# Patient Record
Sex: Male | Born: 1985 | Race: Black or African American | Hispanic: No | Marital: Single | Smoking: Never smoker
Health system: Southern US, Community
[De-identification: ages and names within clinical notes are randomized; demographics above are authoritative.]

## PROBLEM LIST (undated history)

## (undated) DIAGNOSIS — I10 Essential (primary) hypertension: Secondary | ICD-10-CM

---

## 2007-08-23 ENCOUNTER — Emergency Department (HOSPITAL_COMMUNITY): Admission: EM | Admit: 2007-08-23 | Discharge: 2007-08-23 | Payer: Self-pay | Admitting: Emergency Medicine

## 2007-09-06 ENCOUNTER — Emergency Department (HOSPITAL_COMMUNITY): Admission: EM | Admit: 2007-09-06 | Discharge: 2007-09-06 | Payer: Self-pay | Admitting: Emergency Medicine

## 2007-09-12 ENCOUNTER — Encounter (INDEPENDENT_AMBULATORY_CARE_PROVIDER_SITE_OTHER): Payer: Self-pay | Admitting: *Deleted

## 2008-04-11 ENCOUNTER — Emergency Department (HOSPITAL_COMMUNITY): Admission: EM | Admit: 2008-04-11 | Discharge: 2008-04-11 | Payer: Self-pay | Admitting: Emergency Medicine

## 2009-09-22 ENCOUNTER — Emergency Department (HOSPITAL_COMMUNITY): Admission: EM | Admit: 2009-09-22 | Discharge: 2009-09-22 | Payer: Self-pay | Admitting: Emergency Medicine

## 2012-04-03 ENCOUNTER — Encounter (HOSPITAL_COMMUNITY): Payer: Self-pay | Admitting: *Deleted

## 2012-04-03 ENCOUNTER — Emergency Department (HOSPITAL_COMMUNITY)
Admission: EM | Admit: 2012-04-03 | Discharge: 2012-04-04 | Disposition: A | Discharge status: Home / Self Care | Payer: Self-pay | Attending: Emergency Medicine | Admitting: Emergency Medicine

## 2012-04-03 DIAGNOSIS — S61209A Unspecified open wound of unspecified finger without damage to nail, initial encounter: Secondary | ICD-10-CM | POA: Insufficient documentation

## 2012-04-03 DIAGNOSIS — W268XXA Contact with other sharp object(s), not elsewhere classified, initial encounter: Secondary | ICD-10-CM | POA: Insufficient documentation

## 2012-04-03 DIAGNOSIS — IMO0002 Reserved for concepts with insufficient information to code with codable children: Secondary | ICD-10-CM

## 2012-04-03 DIAGNOSIS — Z23 Encounter for immunization: Secondary | ICD-10-CM | POA: Insufficient documentation

## 2012-04-03 DIAGNOSIS — S61409A Unspecified open wound of unspecified hand, initial encounter: Secondary | ICD-10-CM | POA: Insufficient documentation

## 2012-04-03 MED ORDER — TETANUS-DIPHTH-ACELL PERTUSSIS 5-2.5-18.5 LF-MCG/0.5 IM SUSP
0.5000 mL | Freq: Once | INTRAMUSCULAR | Status: AC
  Administered 2012-04-04: 0.5 mL via INTRAMUSCULAR
  Filled 2012-04-03: qty 0.5

## 2012-04-03 MED ORDER — LIDOCAINE HCL 2 % IJ SOLN
20.0000 mL | Freq: Once | INTRAMUSCULAR | Status: AC
  Administered 2012-04-04: 20 mg via INTRADERMAL

## 2012-04-03 NOTE — ED Notes (Signed)
Pt reports cut hand on the sharp end of a broom.  Pt reports cut to palm of hand and thumb. Bleeding currently controlled. Pt is unsure of last tetanus shot.  Pt reports pain is 3/10

## 2012-04-03 NOTE — ED Provider Notes (Signed)
History     CSN: [id number]  Arrival date & time 04/03/12  2153   First MD Initiated Contact with Patient 04/03/12 2315      Chief Complaint  Patient presents with  . Laceration  . Hand Pain    (Consider location/radiation/quality/duration/timing/severity/associated sxs/prior treatment) HPI Comments: Patient presents after cutting his right palm and base of thumb on metal handle of an aluminum broom. Patient rinsed the wounds with water prior to arrival and applied a bandage. Denies numbness, tingling, or weakness in hand. Denies other injuries. Last tetanus uncertain. Onset acute. Course constant. Nothing makes symptoms better or worse.   Patient is a 26 y.o. male presenting with skin laceration. The history is provided by the patient.  Laceration  The incident occurred 1 to 2 hours ago. The laceration is located on the right hand. The laceration is 6 cm in size. The laceration mechanism was a a metal edge. The pain is mild. The pain has been constant since onset. He reports no foreign bodies present. His tetanus status is out of date.    History reviewed. No pertinent past medical history.  History reviewed. No pertinent past surgical history.  No family history on file.  History  Substance Use Topics  . Smoking status: Never Smoker   . Smokeless tobacco: Not on file  . Alcohol Use: Yes      Review of Systems  Constitutional: Negative for activity change.  HENT: Negative for neck pain.   Musculoskeletal: Negative for back pain, joint swelling, arthralgias and gait problem.  Skin: Positive for wound.  Neurological: Negative for weakness and numbness.    Allergies  Review of patient's allergies indicates no known allergies.  Home Medications  No current outpatient prescriptions on file.  BP 159/111  Pulse 61  Temp 98.7 F (37.1 C) (Oral)  Resp 18  Ht 6' (1.829 m)  Wt 175 lb (79.379 kg)  BMI 23.73 kg/m2  SpO2 99%  Physical Exam  Nursing note and vitals  reviewed. Constitutional: He appears well-developed and well-nourished.  HENT:  Head: Normocephalic and atraumatic.  Eyes: Conjunctivae are normal.  Neck: Normal range of motion. Neck supple.  Cardiovascular: Normal pulses.   Pulses:      Radial pulses are 2+ on the right side, and 2+ on the left side.  Musculoskeletal: Normal range of motion. He exhibits tenderness. He exhibits no edema.       Tenderness in area of wound. Cap refill in all digits < 2 seconds. 5/5 strength of all digits and full ROM including flexion/extension of IP joint R thumb, MCP joint R thumb in all directions.   Neurological: He is alert. No sensory deficit.       Motor, sensation, and vascular distal to the injury is fully intact.   Skin: Skin is warm and dry.       Patient with 2 lacerations:  R palm just proximal to 3rd and 4th digits, linear, full skin thickness, hemostatic, clean. Base of wound visualized entirely. No FB seen or palpated. No tendon injury identified.   Base of right thumb, c-shaped, flap, full thickness, hemostatic, clean. Base of wound visualized entirely. No FB seen or palpated. No tendon injury identified.   Psychiatric: He has a normal mood and affect.    ED Course  Procedures (including critical care time)  Labs Reviewed - No data to display No results found.   No diagnosis found.  11:55 PM Patient seen and examined. Tetanus ordered. Will need to suture.  Vital signs reviewed and are as follows: Filed Vitals:   04/03/12 2248  BP: 159/111  Pulse: 61  Temp: 98.7 F (37.1 C)  Resp: 18   LACERATION REPAIR Performed by: Carolee Rota Authorized by: Carolee Rota Consent: Verbal consent obtained. Risks and benefits: risks, benefits and alternatives were discussed Consent given by: patient Patient identity confirmed: provided demographic data Prepped and Draped in normal sterile fashion Wound explored  Laceration Location: R thumb base  Laceration Length: 3cm  No  Foreign Bodies seen or palpated  Anesthesia: local infiltration  Local anesthetic: lidocaine 2% without epinephrine  Anesthetic total: 3 ml  Irrigation method: syringe with splash guard Amount of cleaning: standard  Skin closure: 5-0 Ethilon  Number of sutures: 3  Technique: simple interrupted  Patient tolerance: Patient tolerated the procedure well with no immediate complications.    LACERATION REPAIR Performed by: Carolee Rota Authorized by: Carolee Rota Consent: Verbal consent obtained. Risks and benefits: risks, benefits and alternatives were discussed Consent given by: patient Patient identity confirmed: provided demographic data Prepped and Draped in normal sterile fashion Wound explored  Laceration Location: right palm  Laceration Length: 3cm  No Foreign Bodies seen or palpated  Anesthesia: local infiltration  Local anesthetic: lidocaine 2% without epinephrine  Anesthetic total: 3 ml  Irrigation method: syringe with splash guard Amount of cleaning: standard  Skin closure: 5-0 Ethilon  Number of sutures: 5  Technique: locked running  Patient tolerance: Patient tolerated the procedure well with no immediate complications.   Patient counseled on wound care. Patient counseled on need to return or see PCP/urgent care for suture removal in 7 days. Patient was urged to return to the Emergency Department urgently with worsening pain, swelling, expanding erythema especially if it streaks away from the affected area, fever, or if they have any other concerns. Patient verbalized understanding.    MDM  Patient with 2 lacerations, repaired. No tendon involvement, FB identified or suspected. Full ROM and strength in R hand and digits. No x-rays indicated.         [place], [place] 04/04/12 [phone]

## 2012-04-05 NOTE — ED Provider Notes (Signed)
Medical screening examination/treatment/procedure(s) were performed by non-physician practitioner and as supervising physician I was immediately available for consultation/collaboration.  Arvle Grabe, MD 04/05/12 0841 

## 2013-02-02 ENCOUNTER — Encounter (HOSPITAL_COMMUNITY): Payer: Self-pay | Admitting: *Deleted

## 2013-02-02 ENCOUNTER — Emergency Department (HOSPITAL_COMMUNITY)
Admission: EM | Admit: 2013-02-02 | Discharge: 2013-02-02 | Discharge status: Against Medical Advice | Payer: Self-pay | Attending: Emergency Medicine | Admitting: Emergency Medicine

## 2013-02-02 DIAGNOSIS — IMO0002 Reserved for concepts with insufficient information to code with codable children: Secondary | ICD-10-CM | POA: Insufficient documentation

## 2013-02-02 DIAGNOSIS — Y9302 Activity, running: Secondary | ICD-10-CM | POA: Insufficient documentation

## 2013-02-02 DIAGNOSIS — R296 Repeated falls: Secondary | ICD-10-CM | POA: Insufficient documentation

## 2013-02-02 DIAGNOSIS — Y929 Unspecified place or not applicable: Secondary | ICD-10-CM | POA: Insufficient documentation

## 2013-02-02 NOTE — ED Notes (Signed)
Pt refusing to be seen.  Band aid applied to L knee.  Denies any complaints at this time.
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
## 2013-03-19 ENCOUNTER — Encounter (HOSPITAL_COMMUNITY): Payer: Self-pay | Admitting: Emergency Medicine

## 2013-03-19 ENCOUNTER — Emergency Department (HOSPITAL_COMMUNITY)
Admission: EM | Admit: 2013-03-19 | Discharge: 2013-03-19 | Disposition: A | Discharge status: Home / Self Care | Payer: Self-pay | Attending: Emergency Medicine | Admitting: Emergency Medicine

## 2013-03-19 ENCOUNTER — Emergency Department (HOSPITAL_COMMUNITY): Payer: Self-pay

## 2013-03-19 DIAGNOSIS — M2392 Unspecified internal derangement of left knee: Secondary | ICD-10-CM

## 2013-03-19 DIAGNOSIS — IMO0002 Reserved for concepts with insufficient information to code with codable children: Secondary | ICD-10-CM | POA: Insufficient documentation

## 2013-03-19 DIAGNOSIS — I10 Essential (primary) hypertension: Secondary | ICD-10-CM | POA: Insufficient documentation

## 2013-03-19 DIAGNOSIS — Y939 Activity, unspecified: Secondary | ICD-10-CM | POA: Insufficient documentation

## 2013-03-19 DIAGNOSIS — S8392XA Sprain of unspecified site of left knee, initial encounter: Secondary | ICD-10-CM

## 2013-03-19 DIAGNOSIS — Y929 Unspecified place or not applicable: Secondary | ICD-10-CM | POA: Insufficient documentation

## 2013-03-19 HISTORY — DX: Essential (primary) hypertension: I10
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
## 2013-03-19 NOTE — ED Notes (Signed)
PT. REPORTS LEFT KNEE PAIN - UNABLE TO GIVE CAUSE OF PAIN OR INJURY , AMBULATORY , PRESENTS WITH [employer] OFFICERS.

## 2013-07-24 DIAGNOSIS — I10 Essential (primary) hypertension: Secondary | ICD-10-CM | POA: Insufficient documentation

## 2013-07-24 DIAGNOSIS — J020 Streptococcal pharyngitis: Secondary | ICD-10-CM | POA: Insufficient documentation

## 2013-07-25 ENCOUNTER — Encounter (HOSPITAL_COMMUNITY): Payer: Self-pay | Admitting: Emergency Medicine

## 2013-07-25 ENCOUNTER — Emergency Department (HOSPITAL_COMMUNITY)
Admission: EM | Admit: 2013-07-25 | Discharge: 2013-07-25 | Disposition: A | Discharge status: Home / Self Care | Payer: Self-pay | Attending: Emergency Medicine | Admitting: Emergency Medicine

## 2013-07-25 ENCOUNTER — Telehealth (HOSPITAL_COMMUNITY): Payer: Self-pay | Admitting: Emergency Medicine

## 2013-07-25 DIAGNOSIS — J020 Streptococcal pharyngitis: Secondary | ICD-10-CM

## 2013-07-25 LAB — RAPID STREP SCREEN (MED CTR MEBANE ONLY): Streptococcus, Group A Screen (Direct): POSITIVE — AB

## 2013-07-25 MED ORDER — IBUPROFEN 400 MG PO TABS
800.0000 mg | ORAL_TABLET | Freq: Once | ORAL | Status: AC
  Administered 2013-07-25: 800 mg via ORAL
  Filled 2013-07-25: qty 4

## 2013-07-25 MED ORDER — AMOXICILLIN 500 MG PO CAPS: 500.0000 mg | ORAL_CAPSULE | Freq: Three times a day (TID) | ORAL | Status: AC

## 2013-07-25 NOTE — ED Notes (Addendum)
Pt here with c/o sore throat onset last morning. Pt states he noticed painful swallowing with drinking something in the morning. Redness noted at epiglottis.

## 2013-07-25 NOTE — ED Provider Notes (Signed)
CSN: [id number]     Arrival date & time 07/24/13  2353 History   First MD Initiated Contact with Patient 07/25/13 0007     Chief Complaint  Patient presents with  . Sore Throat   HPI  History provided by the patient. The patient is a 27 year old male with history of hypertension who presents with complaints of sore throat. Patient reports waking up in the morning with a sore throat. He reports having significant increasing pain to the throat area. Pain is worse with any swallowing. He has not used any medicines or treatment for symptoms. He denies any known sick contacts. No recent travel. Denies any associated fever, chills or sweats. Denies any cough or congestion. No other aggravating or alleviating factors. No other associated symptoms.     Past Medical History  Diagnosis Date  . Hypertension    No past surgical history on file. No family history on file. History  Substance Use Topics  . Smoking status: Never Smoker   . Smokeless tobacco: Not on file  . Alcohol Use: Yes    Review of Systems  Constitutional: Negative for fever, chills and diaphoresis.  HENT: Positive for sore throat. Negative for congestion and rhinorrhea.   Respiratory: Negative for cough.   Gastrointestinal: Negative for nausea and vomiting.  All other systems reviewed and are negative.    Allergies  Review of patient's allergies indicates no known allergies.  Home Medications  No current outpatient prescriptions on file. BP 161/107  Pulse 74  Temp(Src) 97.2 F (36.2 C) (Oral)  Resp 16  Ht 6' (1.829 m)  Wt 185 lb 8 oz (84.142 kg)  BMI 25.15 kg/m2  SpO2 100% Physical Exam  Nursing note and vitals reviewed. Constitutional: He is oriented to person, place, and time. He appears well-developed and well-nourished.  HENT:  Head: Normocephalic.  Right Ear: Tympanic membrane normal.  Left Ear: Tympanic membrane normal.  2 significant erythema of the pharynx. Tonsils normal in size without exudate.  Uvula midline.  Eyes: Conjunctivae are normal.  Neck: Normal range of motion. Neck supple.  Cardiovascular: Normal rate and regular rhythm.   Pulmonary/Chest: Effort normal and breath sounds normal. No respiratory distress. He has no wheezes. He has no rales.  Abdominal: Soft.  Lymphadenopathy:    He has cervical adenopathy.  Neurological: He is alert and oriented to person, place, and time.  Skin: Skin is warm. No rash noted.  Psychiatric: He has a normal mood and affect. His behavior is normal.    ED Course  Procedures   Patient seen and evaluated. Patient well-appearing no acute distress. Does not appear severely ill or toxic. Has significant erythema the pharynx. No exudate. Slight anterior cervical adenopathy. No other symptoms.  Strep test positive. Will plan for treatment. Patient agrees with treatment plan.  Results for orders placed during the hospital encounter of 07/25/13  RAPID STREP SCREEN      Result Value Range   Streptococcus, Group A Screen (Direct) POSITIVE (*) NEGATIVE      MDM   1. Strep throat        Angus Seller, PA-C 07/25/13 [phone]

## 2013-07-25 NOTE — ED Provider Notes (Signed)
Medical screening examination/treatment/procedure(s) were performed by non-physician practitioner and as supervising physician I was immediately available for consultation/collaboration.    Vida Roller, MD 07/25/13 [phone]

## 2013-09-01 IMAGING — CR DG KNEE COMPLETE 4+V*L*
4 series · 4 of 4 positions shown · non-contrast
Comparison: None.

CLINICAL DATA: Left knee injury, pain.

LEFT KNEE - COMPLETE 4+ VIEW

[t knee ap left]
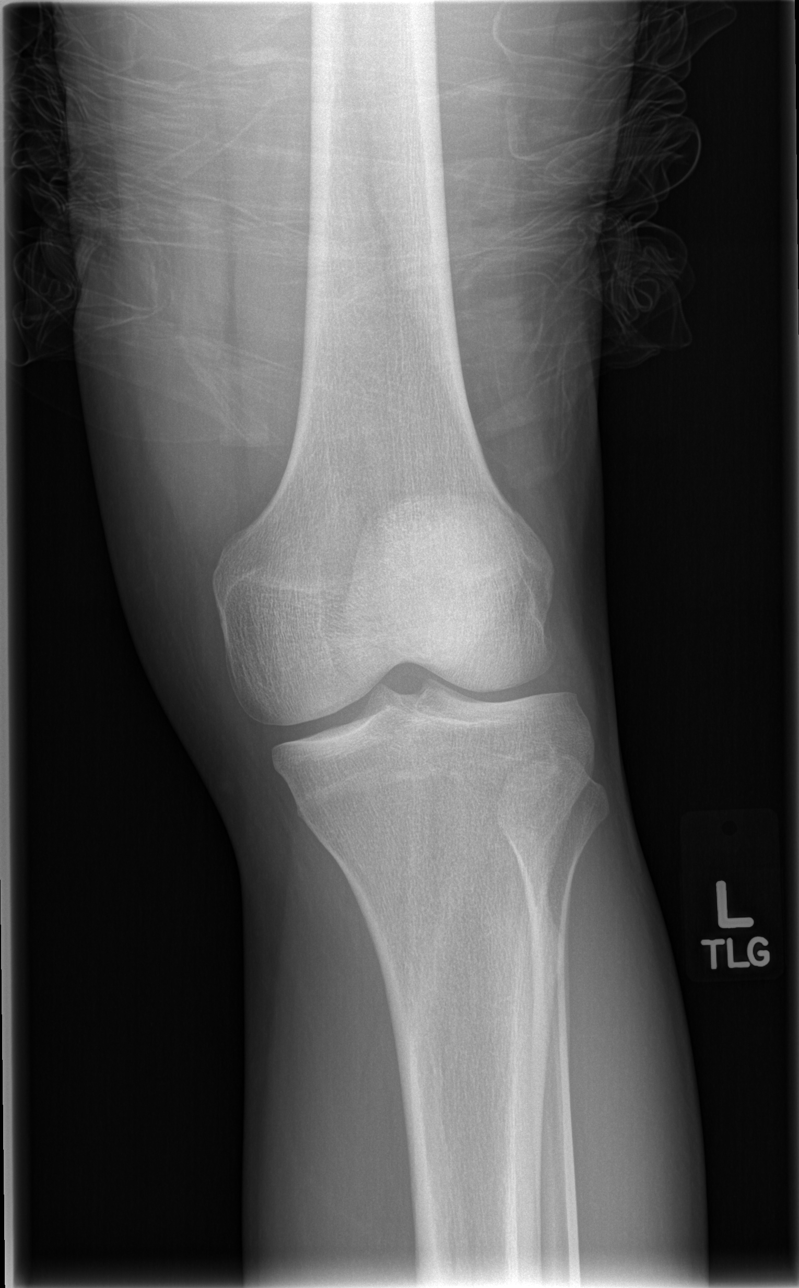

[t knee oblique left (1 of 2)]
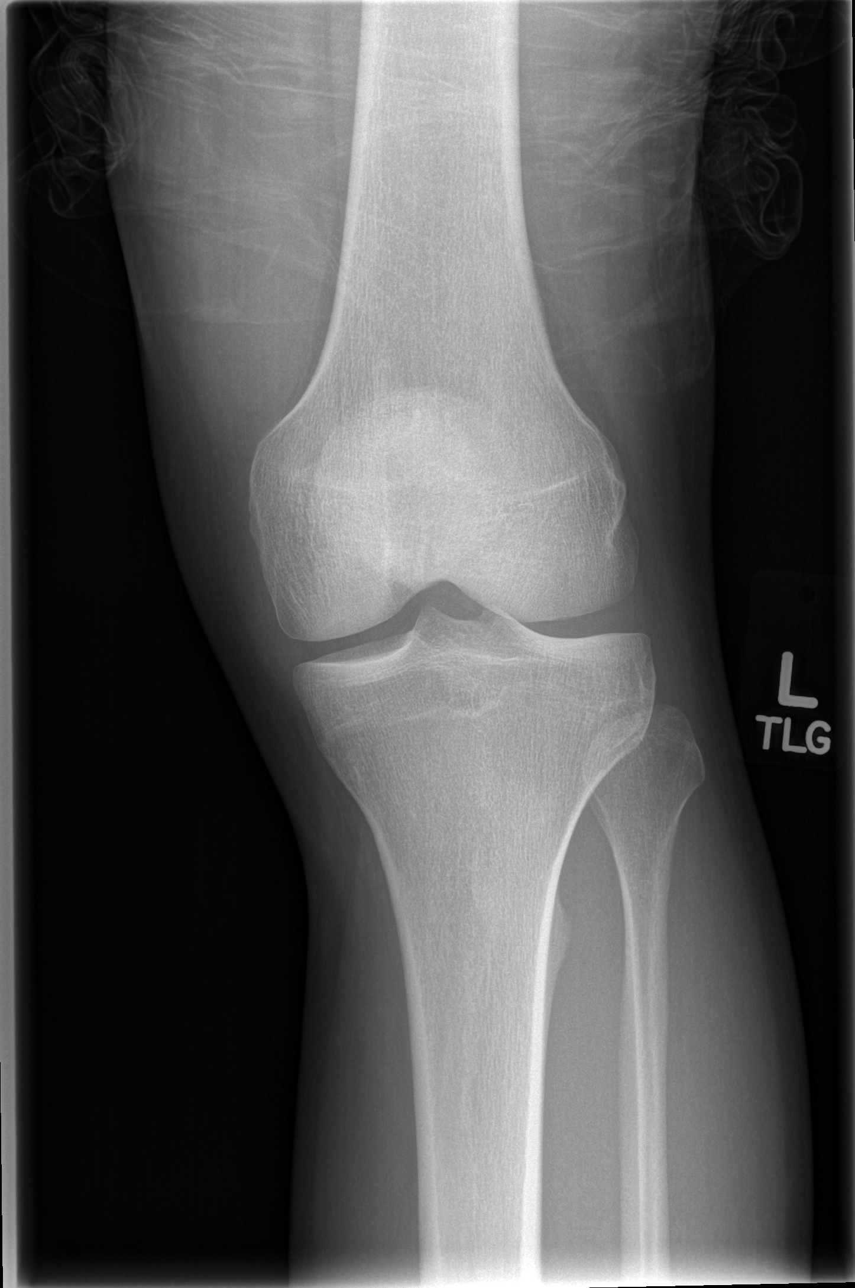

[t knee oblique left (2 of 2)]
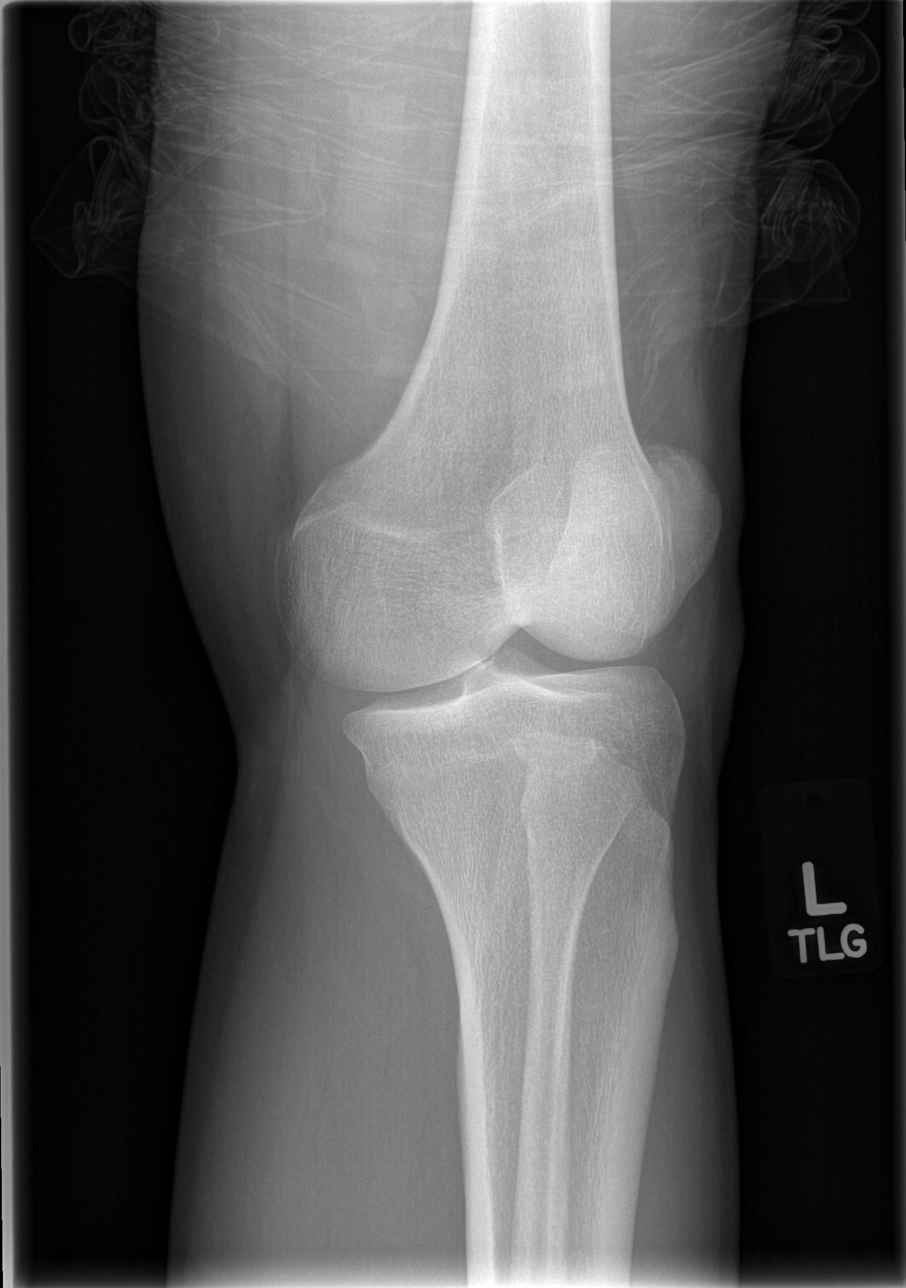

[t knee lat left]
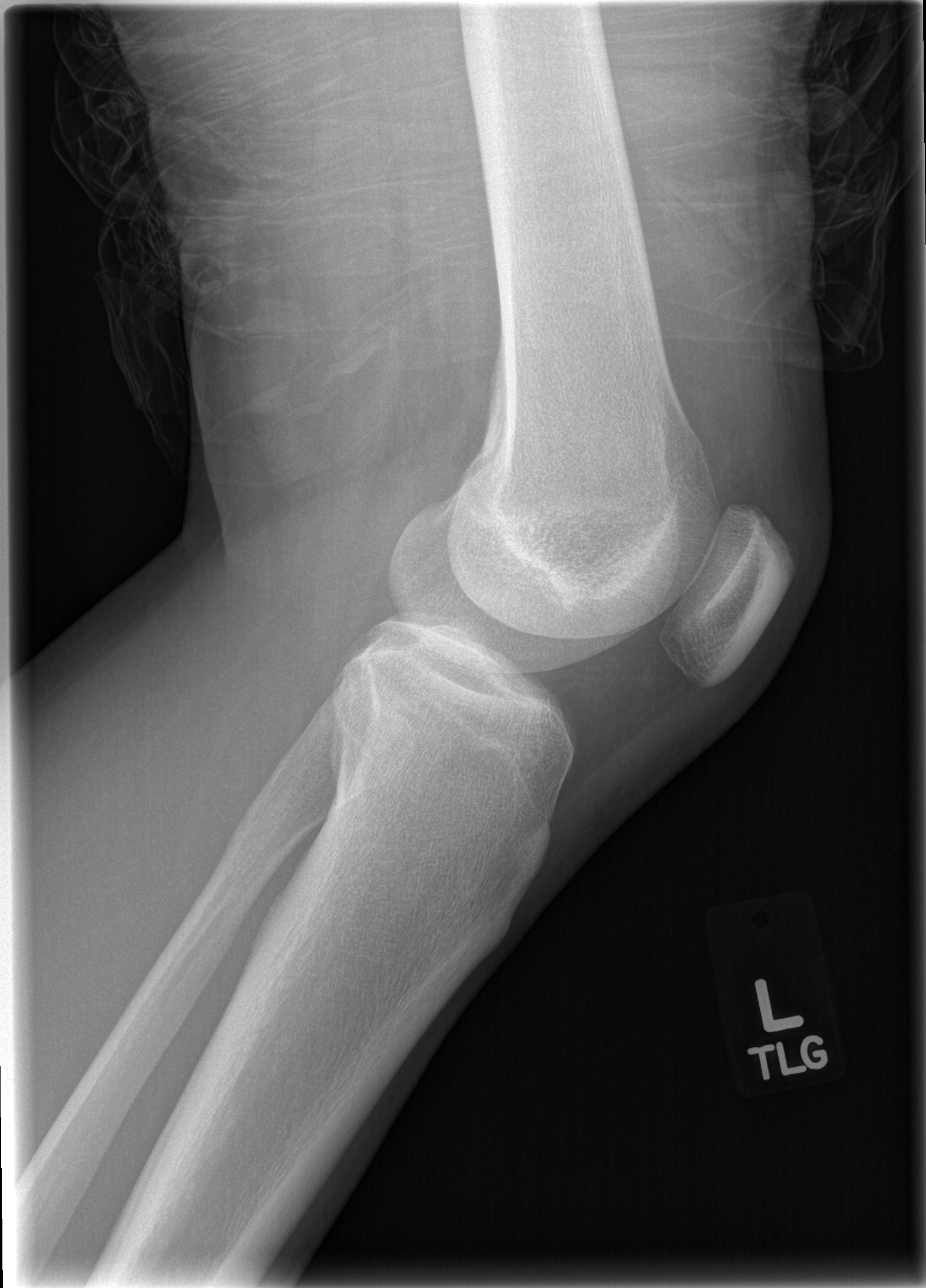

[4 of 4 positions shown; findings below may reference images not displayed]

FINDINGS: Imaged bones, joints and soft tissues appear normal.
IMPRESSION: Negative exam.

## 2019-08-04 ENCOUNTER — Emergency Department (HOSPITAL_COMMUNITY)
Admission: EM | Admit: 2019-08-04 | Discharge: 2019-08-04 | Disposition: A | Discharge status: Home / Self Care | Payer: Self-pay | Attending: Emergency Medicine | Admitting: Emergency Medicine

## 2019-08-04 ENCOUNTER — Emergency Department (HOSPITAL_COMMUNITY): Payer: Self-pay

## 2019-08-04 ENCOUNTER — Encounter (HOSPITAL_COMMUNITY): Payer: Self-pay | Admitting: *Deleted

## 2019-08-04 ENCOUNTER — Other Ambulatory Visit: Payer: Self-pay

## 2019-08-04 DIAGNOSIS — R0781 Pleurodynia: Secondary | ICD-10-CM | POA: Insufficient documentation

## 2019-08-04 DIAGNOSIS — I10 Essential (primary) hypertension: Secondary | ICD-10-CM | POA: Insufficient documentation

## 2019-08-04 LAB — BASIC METABOLIC PANEL
Anion gap: 9 (ref 5–15)
BUN: 10 mg/dL (ref 6–20)
CO2: 26 mmol/L (ref 22–32)
Calcium: 9.2 mg/dL (ref 8.9–10.3)
Chloride: 105 mmol/L (ref 98–111)
Creatinine, Ser: 0.99 mg/dL (ref 0.61–1.24)
GFR calc Af Amer: >60 mL/min (ref >60)
GFR calc non Af Amer: >60 mL/min (ref >60)
Glucose, Bld: 102 mg/dL — ABNORMAL HIGH (ref 70–99)
Potassium: 3.6 mmol/L (ref 3.5–5.1)
Sodium: 140 mmol/L (ref 135–145)

## 2019-08-04 LAB — CBC
HCT: 44.5 % (ref 39.0–52.0)
Hemoglobin: 14.5 g/dL (ref 13.0–17.0)
MCH: 29.3 pg (ref 26.0–34.0)
MCHC: 32.6 g/dL (ref 30.0–36.0)
MCV: 89.9 fL (ref 80.0–100.0)
Platelets: 239 10*3/uL (ref 150–400)
RBC: 4.95 MIL/uL (ref 4.22–5.81)
RDW: 13.7 % (ref 11.5–15.5)
WBC: 10.3 10*3/uL (ref 4.0–10.5)
nRBC: 0 % (ref 0.0–0.2)

## 2019-08-04 LAB — TROPONIN I (HIGH SENSITIVITY)
Troponin I (High Sensitivity): 5 ng/L (ref <18)
Troponin I (High Sensitivity): 6 ng/L (ref <18)

## 2019-08-04 NOTE — Discharge Instructions (Addendum)
Your blood pressure is extremely high. You really should be on medication. Most people do not have symptoms from their high blood pressure. Uncontrolled high blood pressure will give you problems in the future that you will not want to have though. I know you don't like the idea of being on medication but it will lead to problems that would require multiple additional medications. Please follow-up with your family doctor to discuss further.

## 2019-08-04 NOTE — ED Triage Notes (Signed)
Chest pain worse when taking a deep breath, out of BLOOD PRESSURE MEDS

## 2019-08-16 NOTE — ED Provider Notes (Signed)
[facility] EMERGENCY DEPARTMENT Provider Note   CSN: [id number] Arrival date & time: 08/04/19  1507     History Chief Complaint  Patient presents with  . Pleurisy    Steven Morse is a 34 y.o. male.  HPI   34 year old male with chest pain.  Onset 3 to 4 days ago.  He describes a sharp pain in the center of his chest when he takes a deep breath.  Occasional cough.  Pain is sometimes worse with coughing 2.  Cough is nonproductive.  Denies dyspnea.  No unusual leg pain or swelling.  No fevers or chills.  No sick contacts that he is aware of.  Past Medical History:  Diagnosis Date  . Hypertension     There are no problems to display for this patient.   History reviewed. No pertinent surgical history.     No family history on file.  Social History   Tobacco Use  . Smoking status: Never Smoker  Substance Use Topics  . Alcohol use: Yes  . Drug use: Yes    Types: Marijuana    Home Medications Prior to Admission medications   Medication Sig Start Date End Date Taking? Authorizing Provider  amoxicillin (AMOXIL) 500 MG capsule Take 1 capsule (500 mg total) by mouth 3 (three) times daily. 07/25/13   Ivonne Andrew, PA-C    Allergies    Patient has no known allergies.  Review of Systems   Review of Systems All systems reviewed and negative, other than as noted in HPI.  Physical Exam Updated Vital Signs BP (!) 200/116 (BP Location: Right Arm)   Pulse 92   Temp 98.6 F (37 C) (Oral)   Resp 19   Ht 6' (1.829 m)   Wt 81.6 kg   SpO2 100%   BMI 24.41 kg/m   Physical Exam Vitals and nursing note reviewed.  Constitutional:      General: He is not in acute distress.    Appearance: He is well-developed.  HENT:     Head: Normocephalic and atraumatic.  Eyes:     General:        Right eye: No discharge.        Left eye: No discharge.     Conjunctiva/sclera: Conjunctivae normal.  Cardiovascular:     Rate and Rhythm: Normal rate and regular rhythm.   Heart sounds: Normal heart sounds. No murmur. No friction rub. No gallop.   Pulmonary:     Effort: Pulmonary effort is normal. No respiratory distress.     Breath sounds: Normal breath sounds.  Abdominal:     General: There is no distension.     Palpations: Abdomen is soft.     Tenderness: There is no abdominal tenderness.  Musculoskeletal:        General: No tenderness.     Cervical back: Neck supple.     Comments: Lower extremities symmetric as compared to each other. No calf tenderness. Negative Homan's. No palpable cords.   Skin:    General: Skin is warm and dry.  Neurological:     Mental Status: He is alert.  Psychiatric:        Behavior: Behavior normal.        Thought Content: Thought content normal.     ED Results / Procedures / Treatments   Labs (all labs ordered are listed, but only abnormal results are displayed) Labs Reviewed  BASIC METABOLIC PANEL - Abnormal; Notable for the following components:      Result  Value   Glucose, Bld 102 (*)    All other components within normal limits  CBC  TROPONIN I (HIGH SENSITIVITY)  TROPONIN I (HIGH SENSITIVITY)    EKG EKG Interpretation  Date/Time:  Monday August 04 2019 17:24:34 EST Ventricular Rate:  88 PR Interval:  160 QRS Duration: 96 QT Interval:  372 QTC Calculation: 450 R Axis:   128 Text Interpretation: Normal sinus rhythm Right axis deviation Right ventricular hypertrophy Abnormal ECG Confirmed by Veryl Speak [phone]) on 08/05/2019 11:59:11 PM   Radiology No results found.   DG Chest 2 View  Result Date: 08/04/2019 CLINICAL DATA:  Chest pain worsened by taking a deep breath, out of medication for hypertension hypertension EXAM: CHEST - 2 VIEW COMPARISON:  None FINDINGS: The heart size and mediastinal contours are within normal limits. Both lungs are clear. The visualized skeletal structures are unremarkable. IMPRESSION: No active cardiopulmonary disease. Electronically Signed   By: Lavonia Dana M.D.    On: 08/04/2019 17:54    Procedures Procedures (including critical care time)  Medications Ordered in ED Medications - No data to display  ED Course  I have reviewed the triage vital signs and the nursing notes.  Pertinent labs & imaging results that were available during my care of the patient were reviewed by me and considered in my medical decision making (see chart for details).    MDM Rules/Calculators/A&P                      34 year old male with chest pain.  Sounds pleuritic in nature.  Doubt ACS.  Doubt PE, dissection or other emergent process.  He is afebrile.  Well-appearing.  O2 sats are normal on room air.  Chest x-ray without acute abnormality.  EKG with no overt ischemic changes.  I have very low suspicion for emergent etiology of his symptoms.  I am actually a little bit more concerned about his blood pressure in terms of overall health.  I doubt it is related to his acute complaint today.  He is very hypertensive and needs to get this under control.  Labs are reassuring.  He is non-smoker.  Advised he needs to follow-up as soon as he can.  He needs to restart his blood pressure medications.  Emergent return precautions were discussed.  Outpatient follow-up sooner otherwise.    / ED Diagnoses Final diagnoses:  Pleuritic chest pain  Hypertension, unspecified type    Rx / DC Orders ED Discharge Orders    None       Virgel Manifold, MD 08/16/19 [phone]

## 2020-01-17 IMAGING — DX DG CHEST 2V
2 series · 2 of 2 positions shown · non-contrast
Comparison: None

CLINICAL DATA: Chest pain worsened by taking a deep breath, out of
medication for hypertension hypertension

EXAM:
CHEST - 2 VIEW

[chest pa]
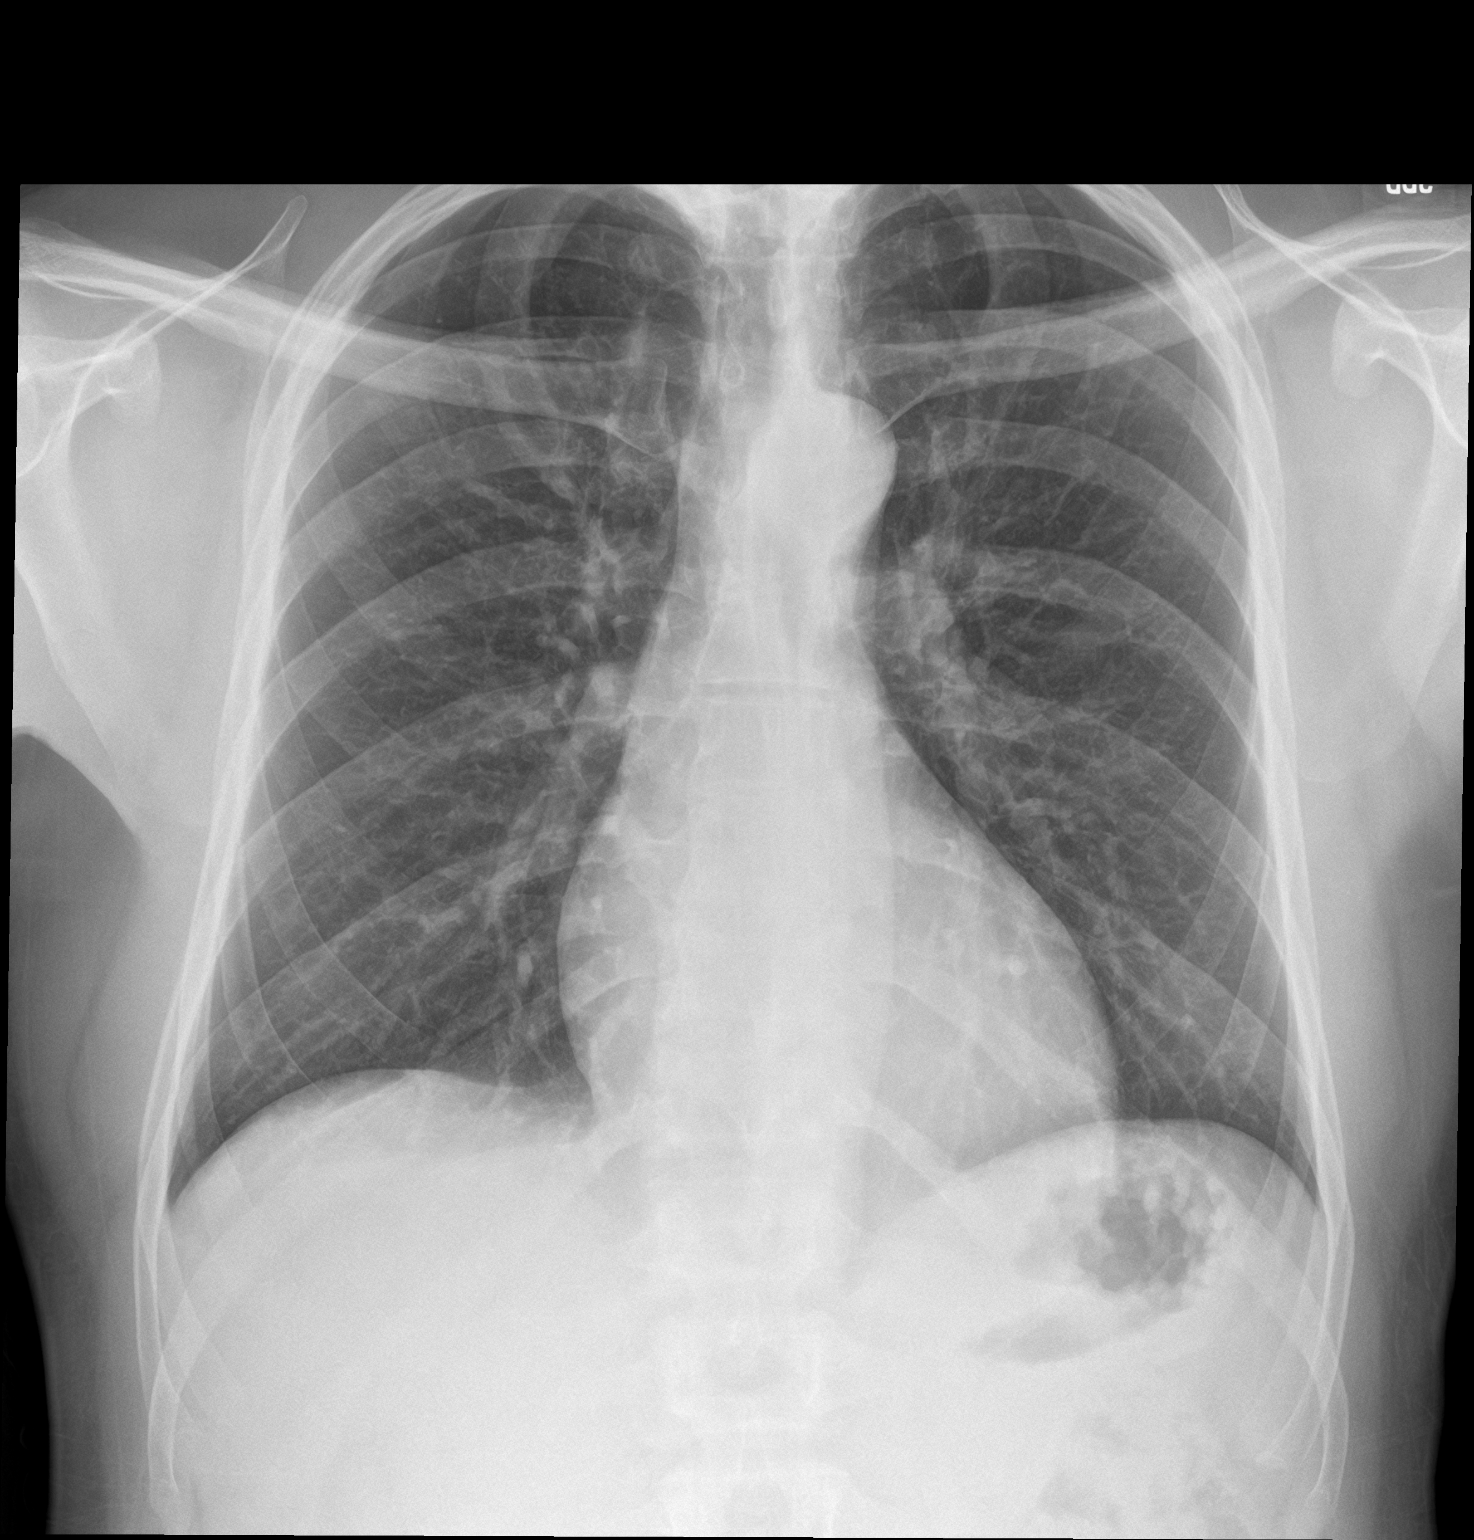

[chest lat]
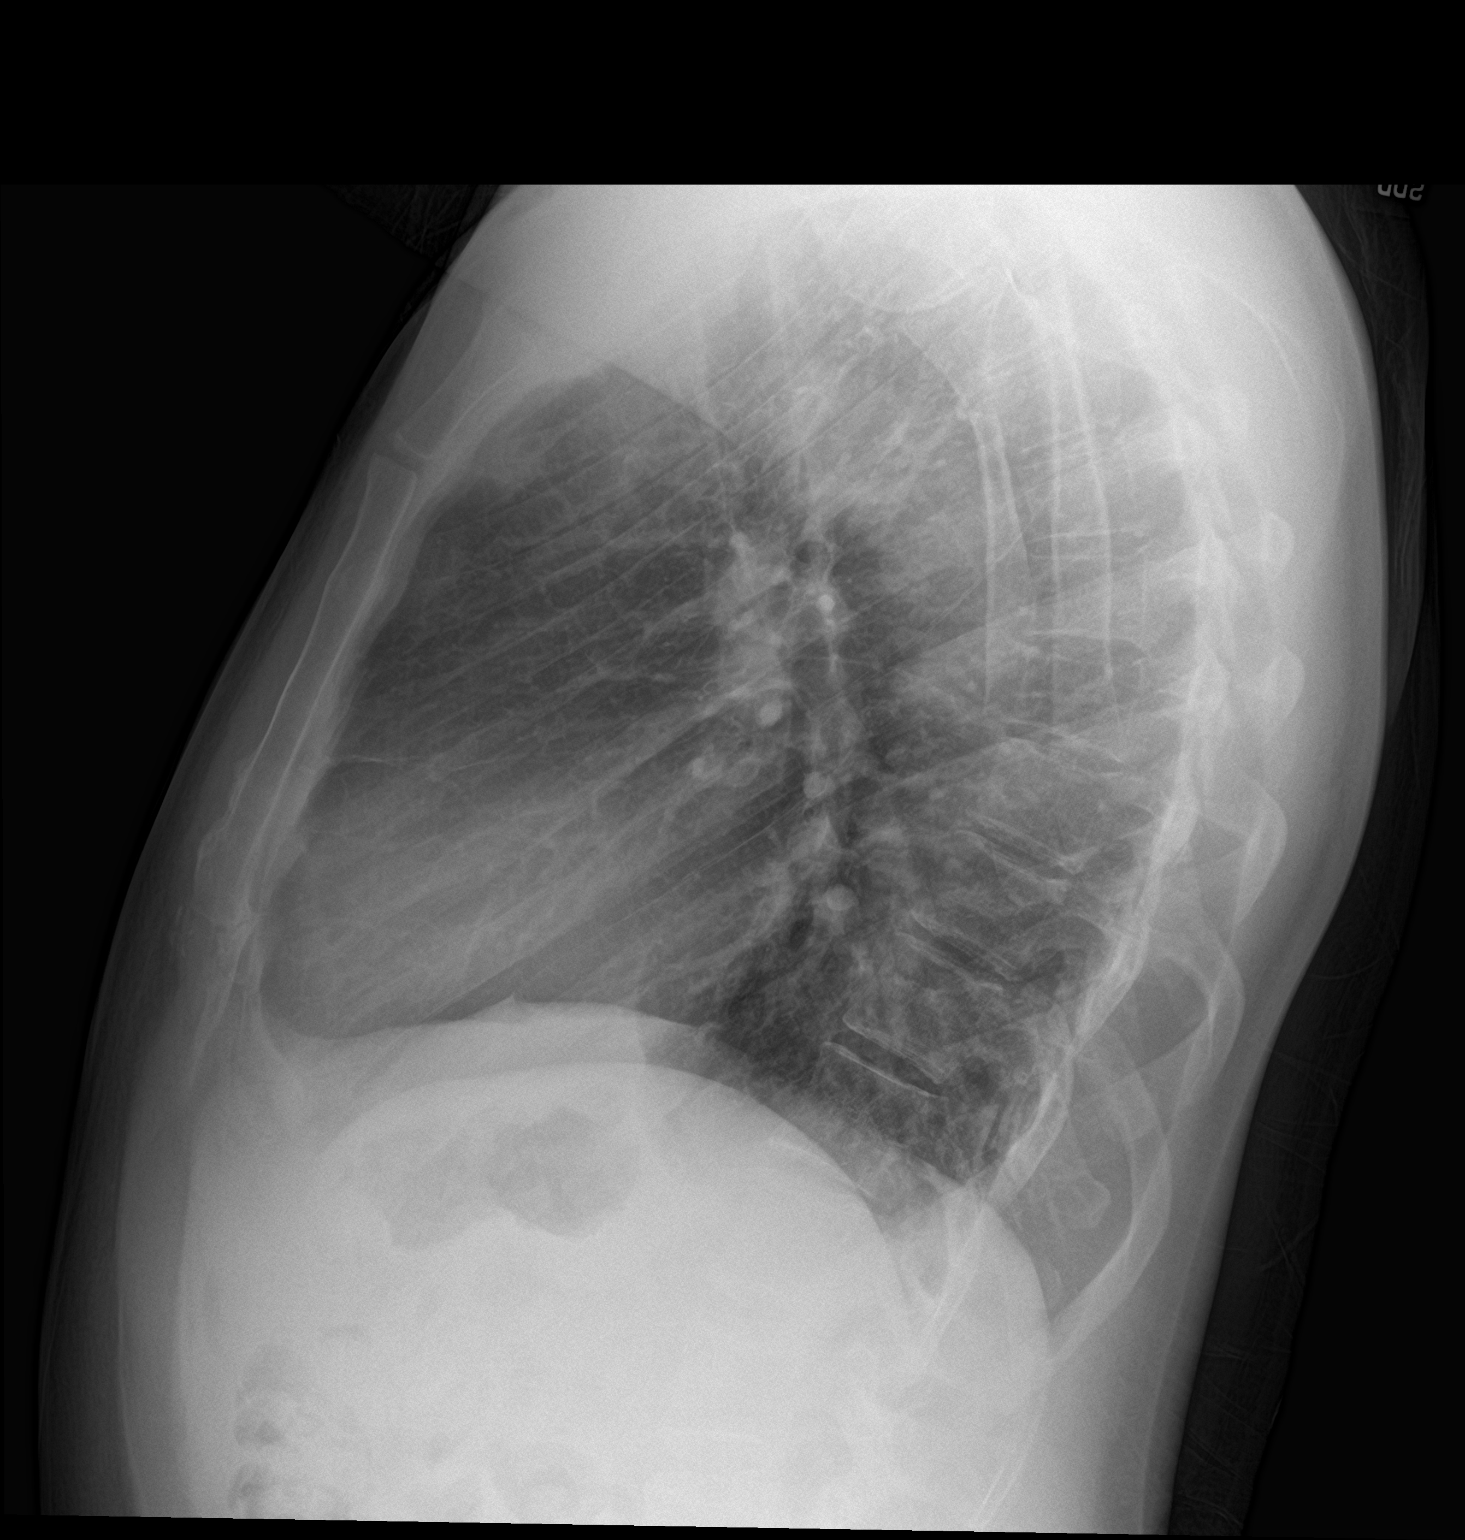

[2 of 2 positions shown; findings below may reference images not displayed]

FINDINGS: The heart size and mediastinal contours are within normal limits.
Both lungs are clear. The visualized skeletal structures are
unremarkable.
IMPRESSION: No active cardiopulmonary disease.

## 2021-07-09 ENCOUNTER — Encounter (HOSPITAL_COMMUNITY): Payer: Self-pay | Admitting: *Deleted

## 2021-07-09 ENCOUNTER — Other Ambulatory Visit: Payer: Self-pay

## 2021-07-09 ENCOUNTER — Emergency Department (HOSPITAL_COMMUNITY)
Admission: EM | Admit: 2021-07-09 | Discharge: 2021-07-10 | Disposition: A | Discharge status: Home / Self Care | Payer: Self-pay | Attending: Emergency Medicine | Admitting: Emergency Medicine

## 2021-07-09 DIAGNOSIS — Z79899 Other long term (current) drug therapy: Secondary | ICD-10-CM | POA: Insufficient documentation

## 2021-07-09 DIAGNOSIS — I10 Essential (primary) hypertension: Secondary | ICD-10-CM | POA: Insufficient documentation

## 2021-07-09 DIAGNOSIS — R040 Epistaxis: Secondary | ICD-10-CM | POA: Insufficient documentation

## 2021-07-09 LAB — CBC WITH DIFFERENTIAL/PLATELET
Abs Immature Granulocytes: 0.01 10*3/uL (ref 0.00–0.07)
Basophils Absolute: 0 10*3/uL (ref 0.0–0.1)
Basophils Relative: 0 %
Eosinophils Absolute: 0.1 10*3/uL (ref 0.0–0.5)
Eosinophils Relative: 2 %
HCT: 44.5 % (ref 39.0–52.0)
Hemoglobin: 15 g/dL (ref 13.0–17.0)
Immature Granulocytes: 0 %
Lymphocytes Relative: 37 %
Lymphs Abs: 1.8 10*3/uL (ref 0.7–4.0)
MCH: 30.1 pg (ref 26.0–34.0)
MCHC: 33.7 g/dL (ref 30.0–36.0)
MCV: 89.2 fL (ref 80.0–100.0)
Monocytes Absolute: 0.5 10*3/uL (ref 0.1–1.0)
Monocytes Relative: 9 %
Neutro Abs: 2.5 10*3/uL (ref 1.7–7.7)
Neutrophils Relative %: 52 %
Platelets: 245 10*3/uL (ref 150–400)
RBC: 4.99 MIL/uL (ref 4.22–5.81)
RDW: 13.5 % (ref 11.5–15.5)
WBC: 4.8 10*3/uL (ref 4.0–10.5)
nRBC: 0 % (ref 0.0–0.2)

## 2021-07-09 LAB — BASIC METABOLIC PANEL
Anion gap: 7 (ref 5–15)
BUN: 12 mg/dL (ref 6–20)
CO2: 25 mmol/L (ref 22–32)
Calcium: 8.9 mg/dL (ref 8.9–10.3)
Chloride: 105 mmol/L (ref 98–111)
Creatinine, Ser: 0.87 mg/dL (ref 0.61–1.24)
GFR, Estimated: >60 mL/min (ref >60)
Glucose, Bld: 104 mg/dL — ABNORMAL HIGH (ref 70–99)
Potassium: 3.3 mmol/L — ABNORMAL LOW (ref 3.5–5.1)
Sodium: 137 mmol/L (ref 135–145)

## 2021-07-09 MED ORDER — AMLODIPINE BESYLATE 10 MG PO TABS: 10.0000 mg | ORAL_TABLET | Freq: Every day | ORAL | 0 refills | Status: DC

## 2021-07-09 MED ORDER — AMLODIPINE BESYLATE 10 MG PO TABS: 10.0000 mg | ORAL_TABLET | Freq: Every day | ORAL | 0 refills | Status: AC

## 2021-07-09 MED ORDER — HYDRALAZINE HCL 25 MG PO TABS
50.0000 mg | ORAL_TABLET | Freq: Once | ORAL | Status: AC
  Administered 2021-07-09: 50 mg via ORAL
  Filled 2021-07-09: qty 2

## 2021-07-09 NOTE — ED Provider Notes (Signed)
[facility] EMERGENCY DEPARTMENT Provider Note   CSN: [id number] Arrival date & time: 07/09/21  2138     History Chief Complaint  Patient presents with   Hypertension    Steven Morse is a 35 y.o. male.  Patient presents chief complaint of high blood pressure.  He states that he was at home when he noticed a bloody nose.  Denies any trauma.  He applied some pressure and it seemed to stop.  Then started again a few hours later and after some pressure stop again.  He states that he used to have a history of hypertension and was on the medication does not recall the name of but has not been on it for a long time.  Checked his blood pressure noted was high and was brought to the ER with his mother.  He also states he had a sharp episode of chest pain that lasted 1 to 2 seconds today mid chest nonradiating and then has resolved without any additional recurrence.  Denies fevers or cough or shortness of breath denies headache or current chest pain or abdominal pain.      Past Medical History:  Diagnosis Date   Hypertension     There are no problems to display for this patient.   History reviewed. No pertinent surgical history.     History reviewed. No pertinent family history.  Social History   Tobacco Use   Smoking status: Never  Substance Use Topics   Alcohol use: Yes   Drug use: Yes    Types: Marijuana    Home Medications Prior to Admission medications   Medication Sig Start Date End Date Taking? Authorizing Provider  amLODipine (NORVASC) 10 MG tablet Take 1 tablet (10 mg total) by mouth daily. 07/09/21 08/08/21 Yes Cheryll Cockayne, MD  amoxicillin (AMOXIL) 500 MG capsule Take 1 capsule (500 mg total) by mouth 3 (three) times daily. 07/25/13   Ivonne Andrew, PA-C    Allergies    Patient has no known allergies.  Review of Systems   Review of Systems  Constitutional:  Negative for fever.  HENT:  Negative for ear pain and sore throat.   Eyes:  Negative for  pain.  Respiratory:  Negative for cough.   Cardiovascular:  Positive for chest pain.  Gastrointestinal:  Negative for abdominal pain.  Genitourinary:  Negative for flank pain.  Musculoskeletal:  Negative for back pain.  Skin:  Negative for color change and rash.  Neurological:  Negative for syncope.  All other systems reviewed and are negative.  Physical Exam Updated Vital Signs BP (!) 224/133   Pulse 63   Temp 98.1 F (36.7 C) (Oral)   Resp 20   Ht 6' (1.829 m)   Wt 83.9 kg   SpO2 100%   BMI 25.09 kg/m   Physical Exam Constitutional:      Appearance: He is well-developed.  HENT:     Head: Normocephalic.     Nose: Nose normal.     Comments: No epistaxis seen currently. Eyes:     Extraocular Movements: Extraocular movements intact.  Cardiovascular:     Rate and Rhythm: Normal rate.  Pulmonary:     Effort: Pulmonary effort is normal.  Skin:    Coloration: Skin is not jaundiced.  Neurological:     General: No focal deficit present.     Mental Status: He is alert and oriented to person, place, and time. Mental status is at baseline.     Cranial Nerves: No  cranial nerve deficit.     Motor: No weakness.     Gait: Gait normal.    ED Results / Procedures / Treatments   Labs (all labs ordered are listed, but only abnormal results are displayed) Labs Reviewed  BASIC METABOLIC PANEL - Abnormal; Notable for the following components:      Result Value   Potassium 3.3 (*)    Glucose, Bld 104 (*)    All other components within normal limits  CBC WITH DIFFERENTIAL/PLATELET    EKG None  Radiology No results found.  Procedures Procedures   Medications Ordered in ED Medications  hydrALAZINE (APRESOLINE) tablet 50 mg (50 mg Oral Given 07/09/21 2222)    ED Course  I have reviewed the triage vital signs and the nursing notes.  Pertinent labs & imaging results that were available during my care of the patient were reviewed by me and considered in my medical  decision making (see chart for details).    MDM Rules/Calculators/A&P                           Patient presents with significant hypertension greater than 200 systolic.  Otherwise clinically stable appearing with no complaints of any pain or discomfort.  He did describe some chest pain, however clinically I do not suspect acute coronary syndrome or sounds very atypical.  Given hydralazine here with improvement of blood pressure.  Will recommend outpatient follow-up with his doctor within the week.  Recommend immediate return for fevers pain headache or any additional concerns.   Final Clinical Impression(s) / ED Diagnoses Final diagnoses:  Hypertension, unspecified type    Rx / DC Orders ED Discharge Orders          Ordered    amLODipine (NORVASC) 10 MG tablet  Daily        07/09/21 2321             Cheryll Cockayne, MD 07/09/21 2324

## 2021-07-09 NOTE — ED Triage Notes (Signed)
Pt here for HTN, EMS reports pt's BP was 240/160.  Nose bleed x 2 recently.  Pt admits to not taking his HTN medication. Recent incarcerated . +mild nausea

## 2021-07-09 NOTE — Discharge Instructions (Addendum)
Call your primary care doctor or specialist as discussed in the next 2-3 days.   Return immediately back to the ER if:  Your symptoms worsen within the next 12-24 hours. You develop new symptoms such as new fevers, persistent vomiting, new pain, shortness of breath, or new weakness or numbness, or if you have any other concerns.  

## 2021-07-10 ENCOUNTER — Emergency Department (HOSPITAL_COMMUNITY)
Admission: EM | Admit: 2021-07-10 | Discharge: 2021-07-10 | Disposition: A | Discharge status: Home / Self Care | Payer: Self-pay | Attending: Emergency Medicine | Admitting: Emergency Medicine

## 2021-07-10 ENCOUNTER — Other Ambulatory Visit: Payer: Self-pay

## 2021-07-10 ENCOUNTER — Encounter (HOSPITAL_COMMUNITY): Payer: Self-pay

## 2021-07-10 DIAGNOSIS — R040 Epistaxis: Secondary | ICD-10-CM | POA: Insufficient documentation

## 2021-07-10 DIAGNOSIS — Z79899 Other long term (current) drug therapy: Secondary | ICD-10-CM | POA: Insufficient documentation

## 2021-07-10 DIAGNOSIS — I10 Essential (primary) hypertension: Secondary | ICD-10-CM | POA: Insufficient documentation

## 2021-07-10 MED ORDER — LIDOCAINE HCL 4 % EX SOLN: Freq: Once | CUTANEOUS | Status: DC

## 2021-07-10 MED ORDER — OXYMETAZOLINE HCL 0.05 % NA SOLN
1.0000 | Freq: Once | NASAL | Status: AC
  Administered 2021-07-10: 11:00:00 1 via NASAL
  Filled 2021-07-10: qty 30

## 2021-07-10 MED ORDER — CLONIDINE HCL 0.2 MG PO TABS
0.2000 mg | ORAL_TABLET | Freq: Once | ORAL | Status: AC
  Administered 2021-07-10: 0.2 mg via ORAL
  Filled 2021-07-10: qty 1

## 2021-07-10 NOTE — ED Triage Notes (Signed)
Pt states he was here for nose bleed and whatever the doctors put up his nose came out, bleeding started back this morning.

## 2021-07-10 NOTE — ED Provider Notes (Signed)
[facility] EMERGENCY DEPARTMENT Provider Note   CSN: [id number] Arrival date & time: 07/10/21  4854     History No chief complaint on file.   Steven Morse is a 35 y.o. male.  HPI Patient presents with nosebleed.  Had been seen yesterday and had a packing placed in.  States he went home and sneezed and it came out.  Since then has had four episodes of bleeding.  Bleeding is coming from the left nostril.  If he dips tobacco goes to the back of his throat but mostly down the front.  No lightheadedness dizziness.  Did have hypertension.  Supposed to be on medicine but states he has not been taking it.  Also hypertension.  States he does drink somewhat heavily and has been trying to cut back.  No tremors.  States he has drank some last night.  No other bleeding.  No history of liver issues from the drinking    Past Medical History:  Diagnosis Date   Hypertension     There are no problems to display for this patient.   History reviewed. No pertinent surgical history.     No family history on file.  Social History   Tobacco Use   Smoking status: Never  Substance Use Topics   Alcohol use: Yes   Drug use: Yes    Types: Marijuana    Home Medications Prior to Admission medications   Medication Sig Start Date End Date Taking? Authorizing Provider  amLODipine (NORVASC) 10 MG tablet Take 1 tablet (10 mg total) by mouth daily. 07/09/21 08/08/21  Cheryll Cockayne, MD  amoxicillin (AMOXIL) 500 MG capsule Take 1 capsule (500 mg total) by mouth 3 (three) times daily. 07/25/13   Ivonne Andrew, PA-C    Allergies    Patient has no known allergies.  Review of Systems   Review of Systems  Constitutional:  Negative for appetite change.  HENT:  Positive for nosebleeds.   Respiratory:  Negative for shortness of breath.   Cardiovascular:  Negative for chest pain.  Gastrointestinal:  Negative for abdominal pain.  Genitourinary:  Negative for flank pain.  Musculoskeletal:   Negative for back pain.  Skin:  Negative for rash.  Neurological:  Positive for headaches.  Psychiatric/Behavioral:  Negative for confusion.    Physical Exam Updated Vital Signs BP (!) 179/125   Pulse 80   Temp 98.6 F (37 C) (Oral)   Resp 18   Ht 6' (1.829 m)   Wt 83.9 kg   SpO2 97%   BMI 25.09 kg/m   Physical Exam Vitals and nursing note reviewed.  HENT:     Head: Atraumatic.  Cardiovascular:     Rate and Rhythm: Regular rhythm.  Pulmonary:     Breath sounds: No wheezing or rhonchi.  Abdominal:     Tenderness: There is no abdominal tenderness.  Musculoskeletal:        General: No tenderness.     Cervical back: Neck supple.  Skin:    Capillary Refill: Capillary refill takes less than 2 seconds.  Neurological:     Mental Status: He is alert and oriented to person, place, and time.    ED Results / Procedures / Treatments   Labs (all labs ordered are listed, but only abnormal results are displayed) Labs Reviewed - No data to display  EKG None  Radiology No results found.  Procedures Procedures   Medications Ordered in ED Medications  oxymetazoline (AFRIN) 0.05 % nasal spray 1 spray (  1 spray Each Nare Given by Other 07/10/21 1045)    ED Course  I have reviewed the triage vital signs and the nursing notes.  Pertinent labs & imaging results that were available during my care of the patient were reviewed by me and considered in my medical decision making (see chart for details).    MDM Rules/Calculators/A&P                           Patient with recurrent nosebleed.  Persistent hypertension.  Did not fill the medicine from yesterday however.  Minus hypertension vitals reassuring.  Does not feel lightheaded.  Had recurrence of bleeding while in the ER.  Unable to see area of active bleeding or contained bleeding for cauterization.  Nasal packing placed.  Follow-up with ENT.  Will fill the medicine from last night and started on antihypertensive.  Doubt acute  endorgan damage but does need treatment Final Clinical Impression(s) / ED Diagnoses Final diagnoses:  Recurrent epistaxis  Hypertension, unspecified type    Rx / DC Orders ED Discharge Orders     None        Benjiman Core, MD 07/10/21 1514

## 2021-07-10 NOTE — Discharge Instructions (Addendum)
Follow-up with ENT for removal of packing.  Return for recurrent bleeding.  Follow-up with your doctor and take the blood pressure medicine prescribed last night.

## 2021-07-10 NOTE — ED Provider Notes (Signed)
  Physical Exam  BP (!) 191/122   Pulse 87   Temp (!) 97.5 F (36.4 C) (Oral)   Resp (!) 23   Ht 6' (1.829 m)   Wt 83.9 kg   SpO2 100%   BMI 25.09 kg/m   Physical Exam HENT:     Nose:     Left Nostril: Epistaxis present.  Cardiovascular:     Rate and Rhythm: Normal rate.  Pulmonary:     Breath sounds: Normal breath sounds.  Neurological:     Mental Status: He is alert.    ED Course/Procedures     .Epistaxis Management  Date/Time: 07/10/2021 12:58 AM Performed by: Gilda Crease, MD Authorized by: Gilda Crease, MD   Consent:    Consent obtained:  Verbal   Consent given by:  Patient   Risks, benefits, and alternatives were discussed: yes     Risks discussed:  Pain and nasal injury Universal protocol:    Procedure explained and questions answered to patient or proxy's satisfaction: yes     Relevant documents present and verified: yes     Test results available: yes     Required blood products, implants, devices, and special equipment available: yes     Site/side marked: yes     Immediately prior to procedure, a time out was called: yes     Patient identity confirmed:  Verbally with patient Anesthesia:    Anesthesia method:  None Procedure details:    Treatment site:  L anterior   Treatment method:  Nasal balloon   Treatment complexity:  Limited   Treatment episode: initial   Post-procedure details:    Assessment:  Bleeding stopped   Procedure completion:  Tolerated well, no immediate complications  MDM  I was asked to see the patient by his nurse for epistaxis.  During the discharge process, patient started bleeding heavily from the left side of his nose.  Examination reveals a very brisk bleed.  I did not feel that I would be able to stop this well enough to anticoagulate, recommended packing.  Packing was placed without difficulty.  Patient has been monitored, bleeding has stopped.  Blood pressure was still elevated, given additional oral  antihypertensives and will discharge.  Follow-up with ENT for packing removal.       Gilda Crease, MD 07/10/21 [phone]

## 2021-07-14 ENCOUNTER — Other Ambulatory Visit: Payer: Self-pay

## 2021-07-14 ENCOUNTER — Encounter (HOSPITAL_COMMUNITY): Payer: Self-pay | Admitting: Emergency Medicine

## 2021-07-14 ENCOUNTER — Emergency Department (HOSPITAL_COMMUNITY)
Admission: EM | Admit: 2021-07-14 | Discharge: 2021-07-14 | Disposition: A | Discharge status: Home / Self Care | Payer: Self-pay | Attending: Emergency Medicine | Admitting: Emergency Medicine

## 2021-07-14 DIAGNOSIS — I10 Essential (primary) hypertension: Secondary | ICD-10-CM | POA: Insufficient documentation

## 2021-07-14 DIAGNOSIS — R040 Epistaxis: Secondary | ICD-10-CM | POA: Insufficient documentation

## 2021-07-14 DIAGNOSIS — Z79899 Other long term (current) drug therapy: Secondary | ICD-10-CM | POA: Insufficient documentation

## 2021-07-14 LAB — HEPATIC FUNCTION PANEL
ALT: 22 U/L (ref 0–44)
AST: 20 U/L (ref 15–41)
Albumin: 3.6 g/dL (ref 3.5–5.0)
Alkaline Phosphatase: 37 U/L — ABNORMAL LOW (ref 38–126)
Bilirubin, Direct: <0.1 mg/dL (ref 0.0–0.2)
Total Bilirubin: 0.2 mg/dL — ABNORMAL LOW (ref 0.3–1.2)
Total Protein: 5.6 g/dL — ABNORMAL LOW (ref 6.5–8.1)

## 2021-07-14 LAB — PROTIME-INR
INR: 1.1 (ref 0.8–1.2)
Prothrombin Time: 14 seconds (ref 11.4–15.2)

## 2021-07-14 LAB — CBC WITH DIFFERENTIAL/PLATELET
Abs Immature Granulocytes: 0.04 10*3/uL (ref 0.00–0.07)
Basophils Absolute: 0 10*3/uL (ref 0.0–0.1)
Basophils Relative: 0 %
Eosinophils Absolute: 0 10*3/uL (ref 0.0–0.5)
Eosinophils Relative: 0 %
HCT: 19.3 % — ABNORMAL LOW (ref 39.0–52.0)
Hemoglobin: 6.5 g/dL — CL (ref 13.0–17.0)
Immature Granulocytes: 0 %
Lymphocytes Relative: 13 %
Lymphs Abs: 1.2 10*3/uL (ref 0.7–4.0)
MCH: 31.1 pg (ref 26.0–34.0)
MCHC: 33.7 g/dL (ref 30.0–36.0)
MCV: 92.3 fL (ref 80.0–100.0)
Monocytes Absolute: 0.5 10*3/uL (ref 0.1–1.0)
Monocytes Relative: 6 %
Neutro Abs: 7.3 10*3/uL (ref 1.7–7.7)
Neutrophils Relative %: 81 %
Platelets: 213 10*3/uL (ref 150–400)
RBC: 2.09 MIL/uL — ABNORMAL LOW (ref 4.22–5.81)
RDW: 14.2 % (ref 11.5–15.5)
WBC: 9.1 10*3/uL (ref 4.0–10.5)
nRBC: 0 % (ref 0.0–0.2)

## 2021-07-14 LAB — BASIC METABOLIC PANEL
Anion gap: 7 (ref 5–15)
BUN: 17 mg/dL (ref 6–20)
CO2: 27 mmol/L (ref 22–32)
Calcium: 8.2 mg/dL — ABNORMAL LOW (ref 8.9–10.3)
Chloride: 105 mmol/L (ref 98–111)
Creatinine, Ser: 0.83 mg/dL (ref 0.61–1.24)
GFR, Estimated: >60 mL/min (ref >60)
Glucose, Bld: 115 mg/dL — ABNORMAL HIGH (ref 70–99)
Potassium: 3.2 mmol/L — ABNORMAL LOW (ref 3.5–5.1)
Sodium: 139 mmol/L (ref 135–145)

## 2021-07-14 LAB — APTT: aPTT: <20 seconds — ABNORMAL LOW (ref 24–36)

## 2021-07-14 LAB — PREPARE RBC (CROSSMATCH)

## 2021-07-14 LAB — ABO/RH: ABO/RH(D): AB POS

## 2021-07-14 MED ORDER — ONDANSETRON 4 MG PO TBDP
4.0000 mg | ORAL_TABLET | Freq: Once | ORAL | Status: AC
  Administered 2021-07-14: 4 mg via ORAL
  Filled 2021-07-14: qty 1

## 2021-07-14 MED ORDER — ACETAMINOPHEN 325 MG PO TABS
650.0000 mg | ORAL_TABLET | Freq: Once | ORAL | Status: AC
  Administered 2021-07-14: 650 mg via ORAL
  Filled 2021-07-14: qty 2

## 2021-07-14 MED ORDER — SODIUM CHLORIDE 0.9 % IV SOLN
10.0000 mL/h | Freq: Once | INTRAVENOUS | Status: AC
  Administered 2021-07-14: 10 mL/h via INTRAVENOUS

## 2021-07-14 MED ORDER — POTASSIUM CHLORIDE CRYS ER 20 MEQ PO TBCR
40.0000 meq | EXTENDED_RELEASE_TABLET | Freq: Once | ORAL | Status: AC
  Administered 2021-07-14: 40 meq via ORAL
  Filled 2021-07-14: qty 2

## 2021-07-14 MED ORDER — FERROUS SULFATE 325 (65 FE) MG PO TABS: 325.0000 mg | ORAL_TABLET | Freq: Every day | ORAL | 0 refills | Status: AC

## 2021-07-14 NOTE — ED Notes (Signed)
Date and time results received: 07/14/21 1110 (use smartphrase ".now" to insert current time)  Test: Hgb Critical Value: 6.5  Name of Provider Notified: Blue, Soijett A, PA-C  Orders Received? Or Actions Taken?: waiting on orders

## 2021-07-14 NOTE — ED Triage Notes (Signed)
Pt returns for a nose bleed via [facility].  Pt states he has lost close to 1/2 a liter of blood since last night. EMS reports the pt has been vomiting blood clots in route.

## 2021-07-14 NOTE — ED Notes (Signed)
ED Provider at bedside speaking and answering pt and pt's mother's questions.

## 2021-07-14 NOTE — ED Provider Notes (Signed)
[facility] EMERGENCY DEPARTMENT Provider Note   CSN: [id number] Arrival date & time: 07/14/21  4098     History Chief Complaint  Patient presents with   Epistaxis    TION TSE is a 35 y.o. male with a past medical history of hypertension who presents to the ED brought in by EMS complaining of epistaxis since last night.  He notes that he was applying manual compression this morning with no relief of his bleeding.  Patient reports history of alcohol use.  Patient was seen in the ED on 11/26 and 11/27 for similar symptoms and given ENT follow-up.  Patient followed up with ENT who removed nasal packing.  He denies picking at his nose or dry skin in his nose.  Patient has associated resolved lightheadedness, palpitations, nausea.  He has tried applying pressure with no relief of his symptoms.  He denies chest pain, shortness of breath, abdominal pain, nausea, vomiting, fever, chills, dizziness, hematochezia, hemoptysis. Denies anticoagulant use.  Patient reports he has continued use of his Norvasc as prescribed.  Per patient chart review: Patient was evaluated in the ED on 11/26 and 11/27 for similar symptoms.  Patient had nasal packing placed both days.  Patient also with hypertension at that time treated in the ED and given prescription for Norvasc.  Patient was referred to ENT specialist.  Patient followed up with ENT specialist 2 days ago with removal of nasal packing.  ENT specialist did not see anything that needed to be cauterized at that time.  The history is provided by the patient. No language interpreter was used.  Epistaxis Location:  L nare Severity:  Moderate Context: hypertension   Context: not anticoagulants, not aspirin use, not drug use, not nose picking, not recent infection, not trauma and not weather change   Relieved by:  Applying pressure Worsened by:  Nothing Ineffective treatments:  Applying pressure Associated symptoms: no blood in oropharynx, no  congestion, no cough, no dizziness, no facial pain, no fever, no headaches, no sneezing, no sore throat and no syncope   Risk factors: alcohol use   Risk factors: no allergies, no change in medication, no frequent nosebleeds, no intranasal steroids, no liver disease, no radiation treatment, no recent nasal surgery and no sinus problems       Past Medical History:  Diagnosis Date   Hypertension     There are no problems to display for this patient.   History reviewed. No pertinent surgical history.     History reviewed. No pertinent family history.  Social History   Tobacco Use   Smoking status: Never  Vaping Use   Vaping Use: Never used  Substance Use Topics   Alcohol use: Yes   Drug use: Yes    Types: Marijuana    Home Medications Prior to Admission medications   Medication Sig Start Date End Date Taking? Authorizing Provider  amLODipine (NORVASC) 10 MG tablet Take 1 tablet (10 mg total) by mouth daily. 07/09/21 08/08/21 Yes Cheryll Cockayne, MD  ferrous sulfate 325 (65 FE) MG tablet Take 1 tablet (325 mg total) by mouth daily. 07/14/21  Yes Gordana Kewley A, PA-C  amoxicillin (AMOXIL) 500 MG capsule Take 1 capsule (500 mg total) by mouth 3 (three) times daily. Patient not taking: Reported on 07/14/2021 07/25/13   Ivonne Andrew, PA-C    Allergies    Patient has no known allergies.  Review of Systems   Review of Systems  Constitutional:  Negative for chills and fever.  HENT:  Positive for nosebleeds. Negative for congestion, sneezing and sore throat.   Respiratory:  Negative for cough and shortness of breath.   Cardiovascular:  Negative for chest pain and syncope.  Gastrointestinal:  Positive for nausea. Negative for abdominal pain and vomiting.  Skin:  Negative for color change and wound.  Neurological:  Positive for light-headedness. Negative for dizziness and headaches.  All other systems reviewed and are negative.  Physical Exam Updated Vital Signs BP 129/84  (BP Location: Right Arm)   Pulse 87   Temp 98.1 F (36.7 C) (Oral)   Resp 17   Ht 6' (1.829 m)   Wt 83.9 kg   SpO2 100%   BMI 25.09 kg/m   Physical Exam Vitals and nursing note reviewed.  Constitutional:      General: He is not in acute distress.    Appearance: He is not diaphoretic.  HENT:     Head: Normocephalic and atraumatic.     Nose: Nose normal. No nasal deformity, septal deviation, laceration, nasal tenderness, congestion or rhinorrhea.     Right Nostril: No foreign body, epistaxis or septal hematoma.     Left Nostril: No foreign body, epistaxis or septal hematoma.     Right Turbinates: Not enlarged or swollen.     Left Turbinates: Not enlarged or swollen.     Comments: No active epistaxis noted.  No swelling noted to bilateral turbinates.  No septal hematoma, no foreign body, no laceration, no septal deviation, no nasal deformity.    Mouth/Throat:     Mouth: Mucous membranes are moist.     Pharynx: Oropharynx is clear. No oropharyngeal exudate.  Eyes:     General: No scleral icterus.    Extraocular Movements: Extraocular movements intact.     Conjunctiva/sclera: Conjunctivae normal.  Cardiovascular:     Rate and Rhythm: Normal rate and regular rhythm.     Pulses: Normal pulses.     Heart sounds: Normal heart sounds.  Pulmonary:     Effort: Pulmonary effort is normal. No respiratory distress.     Breath sounds: Normal breath sounds. No wheezing.  Abdominal:     General: Bowel sounds are normal.     Palpations: Abdomen is soft. There is no mass.     Tenderness: There is no abdominal tenderness. There is no guarding or rebound.  Musculoskeletal:        General: Normal range of motion.     Cervical back: Normal range of motion and neck supple.     Comments: Strength and sensation intact to bilateral upper and lower extremities.  Skin:    General: Skin is warm and dry.  Neurological:     Mental Status: He is alert.     Sensory: Sensation is intact.     Motor:  Motor function is intact.  Psychiatric:        Behavior: Behavior normal.    ED Results / Procedures / Treatments   Labs (all labs ordered are listed, but only abnormal results are displayed) Labs Reviewed  BASIC METABOLIC PANEL - Abnormal; Notable for the following components:      Result Value   Potassium 3.2 (*)    Glucose, Bld 115 (*)    Calcium 8.2 (*)    All other components within normal limits  CBC WITH DIFFERENTIAL/PLATELET - Abnormal; Notable for the following components:   RBC 2.09 (*)    Hemoglobin 6.5 (*)    HCT 19.3 (*)    All other components within  normal limits  APTT - Abnormal; Notable for the following components:   aPTT <20 (*)    All other components within normal limits  HEPATIC FUNCTION PANEL - Abnormal; Notable for the following components:   Total Protein 5.6 (*)    Alkaline Phosphatase 37 (*)    Total Bilirubin 0.2 (*)    All other components within normal limits  PROTIME-INR  TYPE AND SCREEN  PREPARE RBC (CROSSMATCH)  ABO/RH    EKG None  Radiology No results found.  Procedures Procedures   Medications Ordered in ED Medications  ondansetron (ZOFRAN-ODT) disintegrating tablet 4 mg (4 mg Oral Given 07/14/21 1101)  0.9 %  sodium chloride infusion (0 mL/hr Intravenous Stopped 07/14/21 1514)  acetaminophen (TYLENOL) tablet 650 mg (650 mg Oral Given 07/14/21 1256)  potassium chloride SA (KLOR-CON M) CR tablet 40 mEq (40 mEq Oral Given 07/14/21 1523)    ED Course  I have reviewed the triage vital signs and the nursing notes.  Pertinent labs & imaging results that were available during my care of the patient were reviewed by me and considered in my medical decision making (see chart for details).  Clinical Course as of 07/14/21 1752  Thu Jul 14, 2021  1135 Patient reevaluated.  Patient resting comfortably on bed.  Nausea improved with Zofran.  Discussed with patient low hemoglobin level and treatment plan to replenish with 1 unit of pRBC,  patient agreeable to treatment plan.  Discussed treatment plan with patient and sister who both acknowledge and verbalized understanding. [SB]  1254 RN notified of 99.6 temp prior to transfusion. Patient given tylenol. [SB]  1534 Patient re-evaluated prior to discharge.  Patient resting comfortably on stretcher.  Answered patient's and patient's mother questions prior to discharge.  Patient appears safe for discharge at this time. [SB]    Clinical Course User Index [SB] Khoen Genet A, PA-C   MDM Rules/Calculators/A&P                         Patient presents to the ED with epistaxis since last night.  Patient with history of similar symptoms.  Patient with increased alcohol use daily within the past 2 weeks.  Patient denies NSAID use or anticoagulants. On exam patient without active bleeding otherwise no acute cardiovascular, respiratory, abdominal exam findings.  Differential diagnosis includes coagulopathy, dry nasal mucous membranes, trauma, polyp.  Labs showed hemoglobin at 6.5 and potassium at 3.2, otherwise CBC, coag, hepatic function panel, BMP unremarkable.  Patient given Zofran with improvement of his nausea in the ED.  Patient received with 1 unit of pRBC in the ED, patient agreeable to treatment plan.  Patient with temperature of 99.6 prior to blood transfusion, patient given Tylenol.  Potassium repleted in the ED.  Vital signs stable, patient afebrile, oxygen saturation at 100% at discharge.  Doubt coagulopathy as cause of symptoms today, coag panel unremarkable.  Doubt polyp, no appreciable polyp on nasal exam.  This is likely epistaxis in the setting of patient not applying pressure to the nose properly.  Patient educated on proper technique to apply pressure to nose in the ED.  Discussed with patient and family member importance of following up with ENT specialist for further evaluation and management.  Patient mother reports that she called Dr. Avel Sensor office and informed of patient being  in the ED.  Dr. Avel Sensor office noted that they would call back to set up follow-up appointment.  Patient given prescription for iron supplement.  Patient instructed to maintain fluid intake and fiber intake.  Advised continue use of antihypertensive medications.  Discussed with patient the importance of obtaining primary care provider for long-term management.  Patient provided with information for free clinic to establish care with a primary care provider.  Patient acknowledges and verbalizes understanding.  Patient appears safe for discharge at this time.  Follow-up as indicated in discharge paperwork.  Final Clinical Impression(s) / ED Diagnoses Final diagnoses:  Epistaxis    Rx / DC Orders ED Discharge Orders          Ordered    ferrous sulfate 325 (65 FE) MG tablet  Daily        07/14/21 1542             Emberlie Gotcher A, PA-C 07/14/21 1756    Maia Plan, MD 07/18/21 1029

## 2021-07-14 NOTE — Discharge Instructions (Addendum)
Your hemoglobin was low in the ED, you were given 1 unit red blood cells in the ED.  Your potassium was low, you were given potassium in the ED.  Ensure to apply pressure to the soft portion of your nose for 20 minutes.  Call your ENT specialist for further management.  Take the Iron supplement as prescribed. Ensure to maintain fluid intake and fiber intake. You may call [facility] to set up an appointment for primary care.  Return to the ED if you experience increasing/worsening nosebleeds, lightheadedness, chest pain, shortness of breath.

## 2021-07-15 LAB — BPAM RBC
Blood Product Expiration Date: 202212242359
ISSUE DATE / TIME: 202212011259
Unit Type and Rh: 6200

## 2021-07-15 LAB — TYPE AND SCREEN
ABO/RH(D): AB POS
Antibody Screen: NEGATIVE
Unit division: 0
